# Patient Record
Sex: Female | Born: 1980 | Race: White | Hispanic: No | Marital: Single | Smoking: Current every day smoker
Health system: Southern US, Community
[De-identification: ages and names within clinical notes are randomized; demographics above are authoritative.]

## PROBLEM LIST (undated history)

## (undated) DIAGNOSIS — G8929 Other chronic pain: Secondary | ICD-10-CM

## (undated) DIAGNOSIS — F4310 Post-traumatic stress disorder, unspecified: Secondary | ICD-10-CM

## (undated) DIAGNOSIS — Z21 Asymptomatic human immunodeficiency virus [HIV] infection status: Secondary | ICD-10-CM

## (undated) DIAGNOSIS — F209 Schizophrenia, unspecified: Secondary | ICD-10-CM

## (undated) DIAGNOSIS — M549 Dorsalgia, unspecified: Secondary | ICD-10-CM

## (undated) HISTORY — PX: TUBAL LIGATION: SHX77

---

## 2004-09-24 ENCOUNTER — Emergency Department (HOSPITAL_COMMUNITY): Admission: EM | Admit: 2004-09-24 | Discharge: 2004-09-24 | Payer: Self-pay | Admitting: Emergency Medicine

## 2005-01-17 ENCOUNTER — Emergency Department (HOSPITAL_COMMUNITY): Admission: EM | Admit: 2005-01-17 | Discharge: 2005-01-18 | Payer: Self-pay | Admitting: *Deleted

## 2005-11-25 IMAGING — US US PELVIS COMPLETE MODIFY
1 series · 14 of 25 positions shown · non-contrast
Comparison: none

CLINICAL DATA: Bleeding x 3 weeks.  Question abscess.
 TRANSABDOMINAL AND TRANSVAGINAL NON-OB PELVIC ULTRASOUND:
 The uterus is normal in size and contour, measuring 8.0 x 4.0 x 4.9 cm in size.  There are no focal uterine lesions.  The endometrial stripe thickness measures 6 mm.  The ovaries are normal in size and contour with small simple appearing physiologic cysts present bilaterally.  There is no free pelvic fluid.

[Series 1: gyn · 0.32mm/px · 14 of 28 slices shown]
[im 1/28]
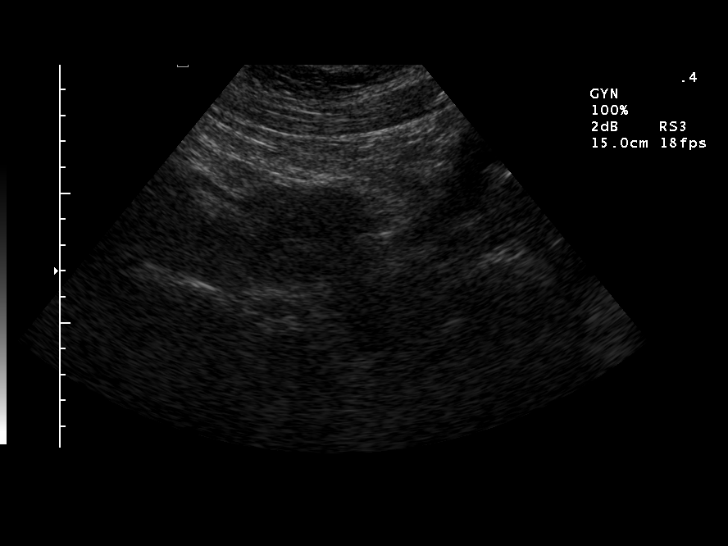
[im 3/28]
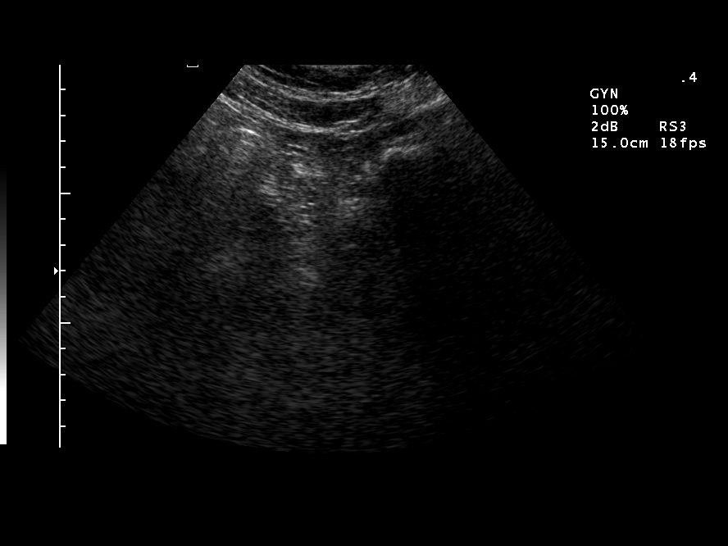
[im 5/28]
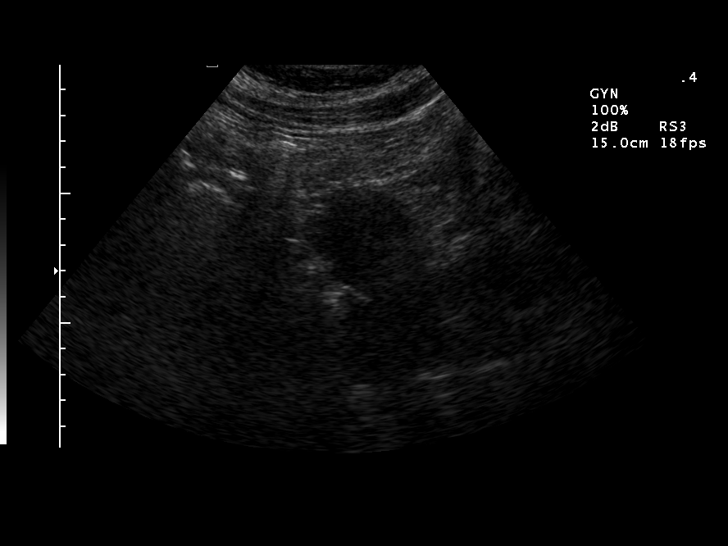
[im 7/28]
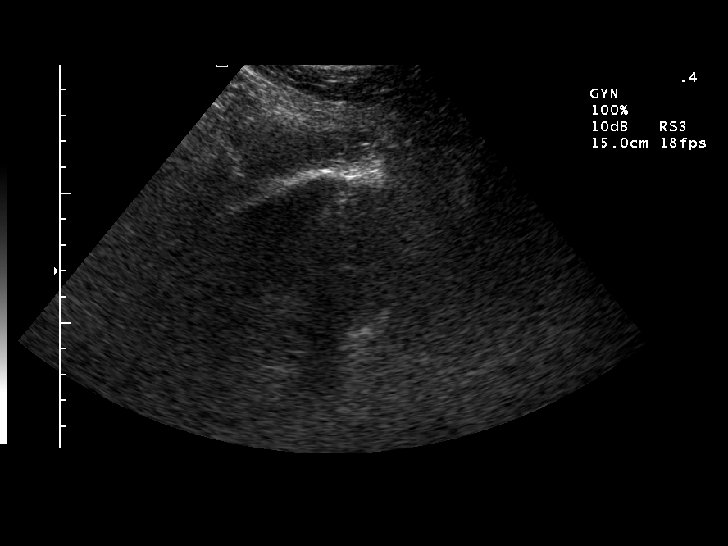
[im 10/28]
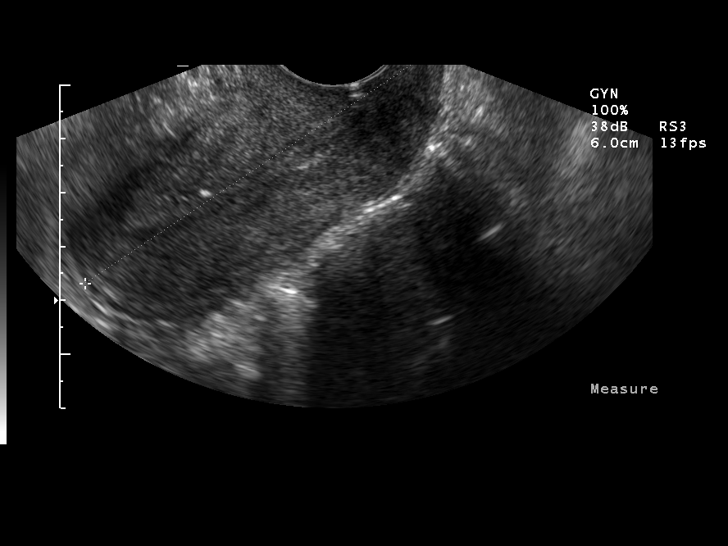
[im 11/28]
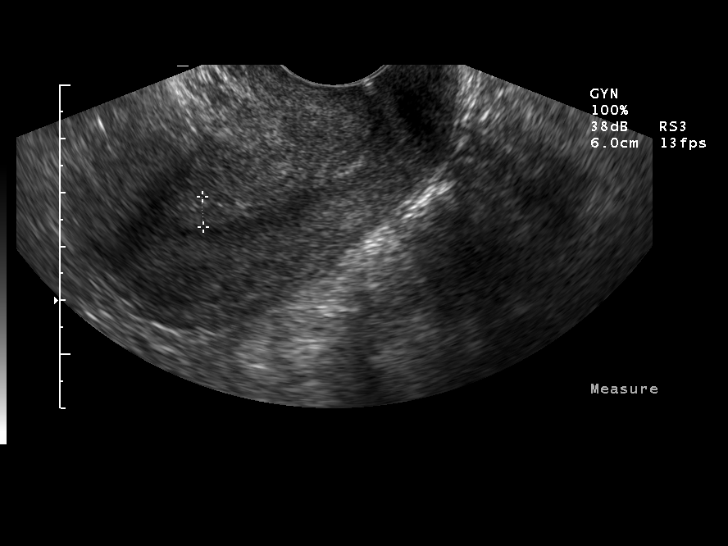
[im 13/28]
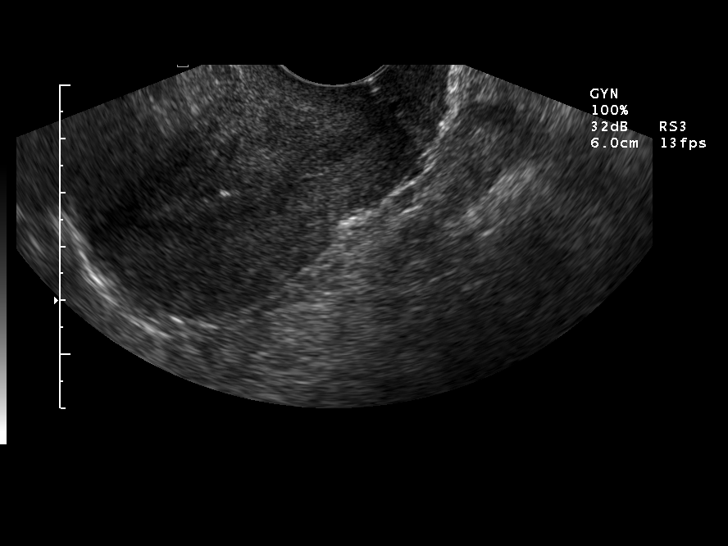
[im 15/28]
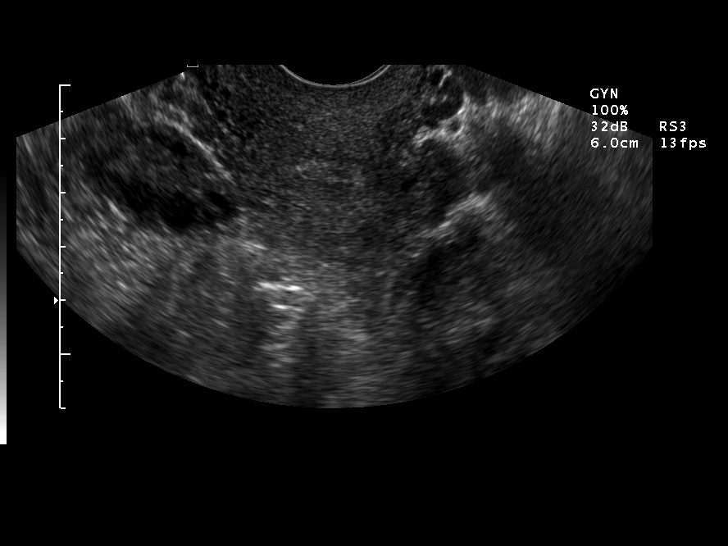
[im 17/28]
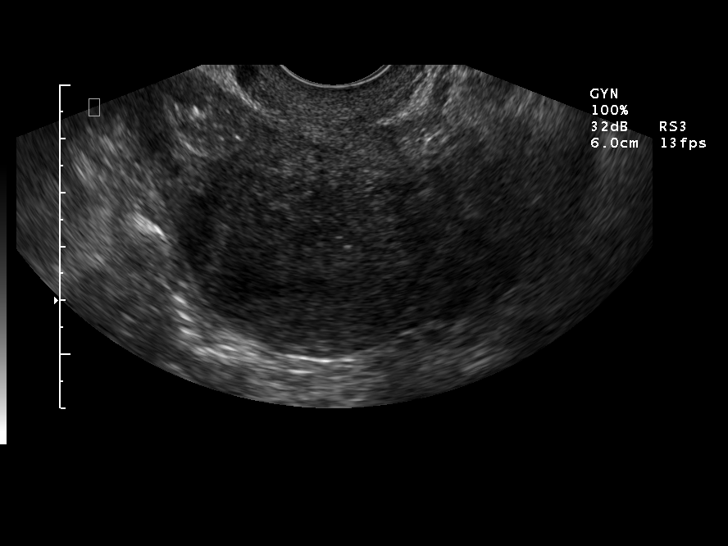
[im 19/28]
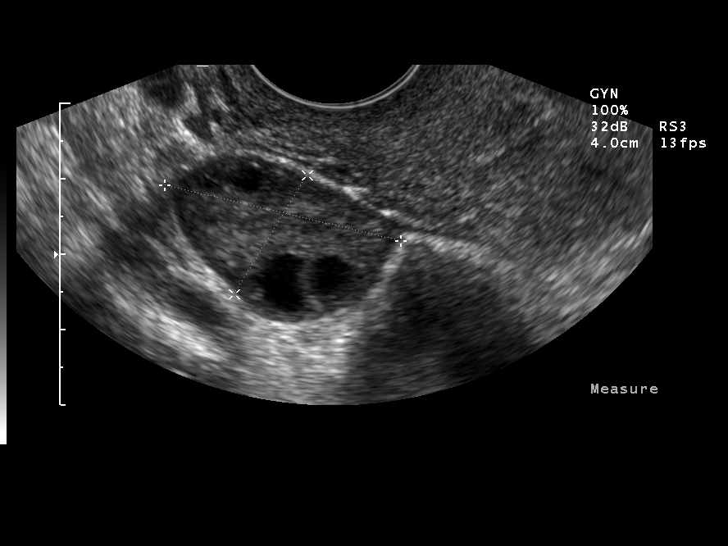
[im 21/28]
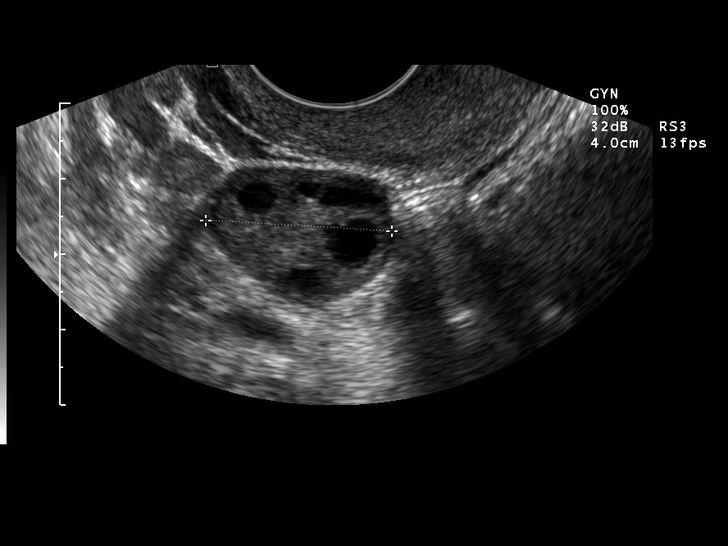
[im 23/28]
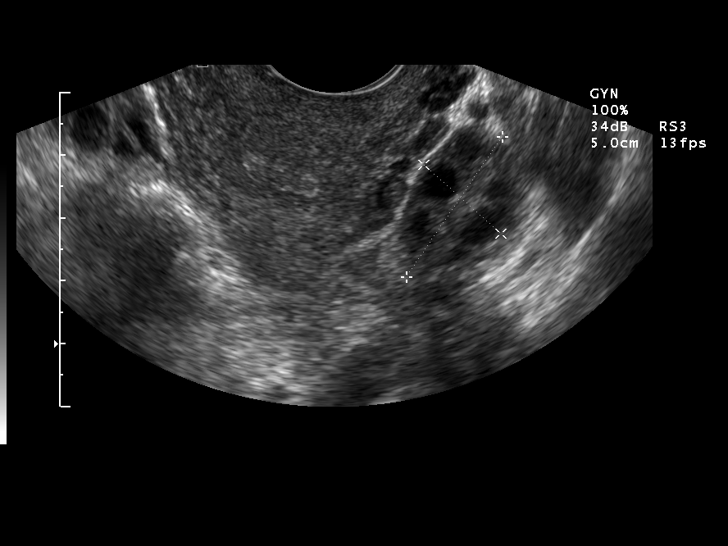
[im 25/28]
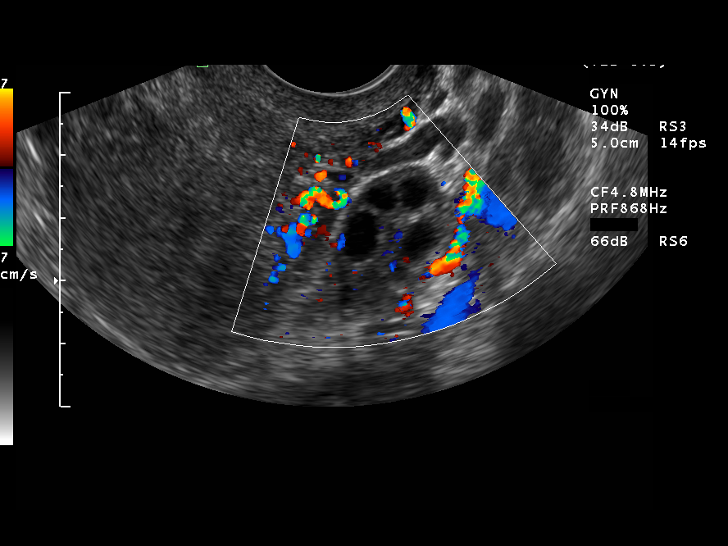
[im 28/28]
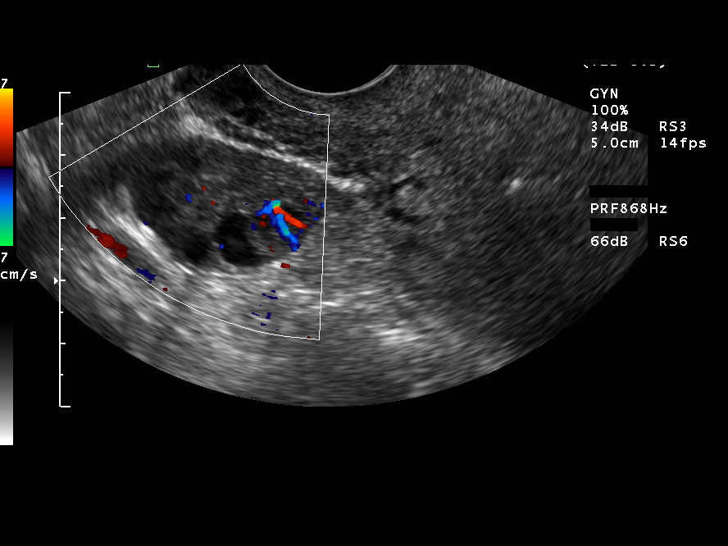

[14 of 25 positions shown; findings below may reference images not displayed]

IMPRESSION: Normal study.

## 2016-09-12 ENCOUNTER — Emergency Department (HOSPITAL_COMMUNITY)
Admission: EM | Admit: 2016-09-12 | Discharge: 2016-09-12 | Disposition: A | Discharge status: Home / Self Care | Payer: No Typology Code available for payment source | Attending: Emergency Medicine | Admitting: Emergency Medicine

## 2016-09-12 ENCOUNTER — Encounter (HOSPITAL_COMMUNITY): Payer: Self-pay | Admitting: Emergency Medicine

## 2016-09-12 ENCOUNTER — Emergency Department (HOSPITAL_COMMUNITY): Payer: No Typology Code available for payment source

## 2016-09-12 DIAGNOSIS — F17200 Nicotine dependence, unspecified, uncomplicated: Secondary | ICD-10-CM | POA: Insufficient documentation

## 2016-09-12 DIAGNOSIS — Y999 Unspecified external cause status: Secondary | ICD-10-CM | POA: Diagnosis not present

## 2016-09-12 DIAGNOSIS — Y939 Activity, unspecified: Secondary | ICD-10-CM | POA: Insufficient documentation

## 2016-09-12 DIAGNOSIS — Y92410 Unspecified street and highway as the place of occurrence of the external cause: Secondary | ICD-10-CM | POA: Diagnosis not present

## 2016-09-12 DIAGNOSIS — M546 Pain in thoracic spine: Secondary | ICD-10-CM | POA: Diagnosis not present

## 2016-09-12 HISTORY — DX: Dorsalgia, unspecified: M54.9

## 2016-09-12 HISTORY — DX: Other chronic pain: G89.29

## 2016-09-12 MED ORDER — DICLOFENAC SODIUM 75 MG PO TBEC: 75.0000 mg | DELAYED_RELEASE_TABLET | Freq: Two times a day (BID) | ORAL | 0 refills | Status: AC

## 2016-09-12 MED ORDER — METHOCARBAMOL 500 MG PO TABS: 500.0000 mg | ORAL_TABLET | Freq: Two times a day (BID) | ORAL | 0 refills | Status: DC
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
## 2016-09-12 NOTE — ED Notes (Signed)
Patient was alert, oriented and stable upon discharge. RN went over AVS and patient had no further questions.  
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
## 2024-01-12 ENCOUNTER — Emergency Department (HOSPITAL_BASED_OUTPATIENT_CLINIC_OR_DEPARTMENT_OTHER)

## 2024-01-12 ENCOUNTER — Encounter (HOSPITAL_BASED_OUTPATIENT_CLINIC_OR_DEPARTMENT_OTHER): Payer: Self-pay | Admitting: Emergency Medicine

## 2024-01-12 ENCOUNTER — Emergency Department (HOSPITAL_BASED_OUTPATIENT_CLINIC_OR_DEPARTMENT_OTHER)
Admission: EM | Admit: 2024-01-12 | Discharge: 2024-01-12 | Discharge status: Against Medical Advice | Attending: Emergency Medicine | Admitting: Emergency Medicine

## 2024-01-12 DIAGNOSIS — R059 Cough, unspecified: Secondary | ICD-10-CM | POA: Diagnosis not present

## 2024-01-12 DIAGNOSIS — M25562 Pain in left knee: Secondary | ICD-10-CM | POA: Insufficient documentation

## 2024-01-12 DIAGNOSIS — Z5321 Procedure and treatment not carried out due to patient leaving prior to being seen by health care provider: Secondary | ICD-10-CM | POA: Diagnosis not present

## 2024-01-12 DIAGNOSIS — M25511 Pain in right shoulder: Secondary | ICD-10-CM | POA: Insufficient documentation

## 2024-01-12 DIAGNOSIS — N939 Abnormal uterine and vaginal bleeding, unspecified: Secondary | ICD-10-CM | POA: Insufficient documentation

## 2024-01-12 HISTORY — DX: Asymptomatic human immunodeficiency virus (hiv) infection status: Z21

## 2024-01-12 HISTORY — DX: Post-traumatic stress disorder, unspecified: F43.10

## 2024-01-12 HISTORY — DX: Schizophrenia, unspecified: F20.9

## 2024-01-12 LAB — URINALYSIS, ROUTINE W REFLEX MICROSCOPIC
Bilirubin Urine: NEGATIVE
Glucose, UA: NEGATIVE mg/dL
Ketones, ur: NEGATIVE mg/dL
Leukocytes,Ua: NEGATIVE
Nitrite: NEGATIVE
Protein, ur: NEGATIVE mg/dL
Specific Gravity, Urine: 1.02 (ref 1.005–1.030)
pH: 6.5 (ref 5.0–8.0)

## 2024-01-12 LAB — URINALYSIS, MICROSCOPIC (REFLEX)

## 2024-01-12 LAB — RESP PANEL BY RT-PCR (RSV, FLU A&B, COVID)  RVPGX2
Influenza A by PCR: NEGATIVE
Influenza B by PCR: NEGATIVE
Resp Syncytial Virus by PCR: NEGATIVE
SARS Coronavirus 2 by RT PCR: NEGATIVE

## 2024-01-12 LAB — PREGNANCY, URINE: Preg Test, Ur: NEGATIVE

## 2024-01-12 NOTE — ED Triage Notes (Signed)
 Pt fell on Thurs; c/o LT knee pain and RT shoulder pain; pt also reports cough x 1 wk and wants to be checked for STIs; c/o vaginal bleeding x 1 month

## 2024-06-22 ENCOUNTER — Emergency Department (HOSPITAL_BASED_OUTPATIENT_CLINIC_OR_DEPARTMENT_OTHER): Admission: EM | Admit: 2024-06-22 | Discharge: 2024-06-22 | Disposition: A | Discharge status: Home / Self Care

## 2024-06-22 ENCOUNTER — Encounter (HOSPITAL_BASED_OUTPATIENT_CLINIC_OR_DEPARTMENT_OTHER): Payer: Self-pay | Admitting: Emergency Medicine

## 2024-06-22 DIAGNOSIS — Z113 Encounter for screening for infections with a predominantly sexual mode of transmission: Secondary | ICD-10-CM | POA: Insufficient documentation

## 2024-06-22 DIAGNOSIS — N898 Other specified noninflammatory disorders of vagina: Secondary | ICD-10-CM | POA: Diagnosis present

## 2024-06-22 DIAGNOSIS — N390 Urinary tract infection, site not specified: Secondary | ICD-10-CM

## 2024-06-22 DIAGNOSIS — A5900 Urogenital trichomoniasis, unspecified: Secondary | ICD-10-CM | POA: Diagnosis not present

## 2024-06-22 DIAGNOSIS — A599 Trichomoniasis, unspecified: Secondary | ICD-10-CM

## 2024-06-22 LAB — URINALYSIS, MICROSCOPIC (REFLEX)

## 2024-06-22 LAB — URINALYSIS, ROUTINE W REFLEX MICROSCOPIC
Glucose, UA: NEGATIVE mg/dL
Ketones, ur: NEGATIVE mg/dL
Leukocytes,Ua: NEGATIVE
Nitrite: NEGATIVE
Protein, ur: 30 mg/dL — AB
Specific Gravity, Urine: >=1.03 (ref 1.005–1.030)
pH: 5.5 (ref 5.0–8.0)

## 2024-06-22 LAB — WET PREP, GENITAL
Clue Cells Wet Prep HPF POC: NONE SEEN
Sperm: NONE SEEN
WBC, Wet Prep HPF POC: <10 (ref <10)
Yeast Wet Prep HPF POC: NONE SEEN

## 2024-06-22 LAB — PREGNANCY, URINE: Preg Test, Ur: NEGATIVE

## 2024-06-22 MED ORDER — OXYCODONE HCL 5 MG PO TABS
5.0000 mg | ORAL_TABLET | Freq: Once | ORAL | Status: AC
  Administered 2024-06-22: 5 mg via ORAL
  Filled 2024-06-22: qty 1

## 2024-06-22 MED ORDER — METRONIDAZOLE 500 MG PO TABS: 500.0000 mg | ORAL_TABLET | Freq: Two times a day (BID) | ORAL | 0 refills | Status: AC

## 2024-06-22 MED ORDER — METRONIDAZOLE 500 MG PO TABS
500.0000 mg | ORAL_TABLET | Freq: Once | ORAL | Status: AC
  Administered 2024-06-22: 500 mg via ORAL
  Filled 2024-06-22: qty 1

## 2024-06-22 MED ORDER — IBUPROFEN 400 MG PO TABS
400.0000 mg | ORAL_TABLET | Freq: Once | ORAL | Status: AC
  Administered 2024-06-22: 400 mg via ORAL
  Filled 2024-06-22: qty 1

## 2024-06-22 MED ORDER — METHOCARBAMOL 500 MG PO TABS
500.0000 mg | ORAL_TABLET | Freq: Once | ORAL | Status: AC
  Administered 2024-06-22: 500 mg via ORAL
  Filled 2024-06-22: qty 1

## 2024-06-22 MED ORDER — METHOCARBAMOL 500 MG PO TABS
500.0000 mg | ORAL_TABLET | Freq: Two times a day (BID) | ORAL | 0 refills | Status: AC
Start: 2024-06-22 — End: ?

## 2024-06-22 MED ORDER — LIDOCAINE 5 % EX PTCH
1.0000 | MEDICATED_PATCH | Freq: Once | CUTANEOUS | Status: DC
  Administered 2024-06-22: 1 via TRANSDERMAL
  Filled 2024-06-22: qty 1

## 2024-06-22 MED ORDER — LIDOCAINE 5 % EX PTCH: 1.0000 | MEDICATED_PATCH | CUTANEOUS | 0 refills | Status: AC

## 2024-06-22 MED ORDER — NITROFURANTOIN MONOHYD MACRO 100 MG PO CAPS
100.0000 mg | ORAL_CAPSULE | Freq: Two times a day (BID) | ORAL | 0 refills | Status: AC
Start: 2024-06-22 — End: ?

## 2024-06-22 MED ORDER — DOXYCYCLINE HYCLATE 100 MG PO CAPS: 100.0000 mg | ORAL_CAPSULE | Freq: Two times a day (BID) | ORAL | 0 refills | Status: AC

## 2024-06-22 MED ORDER — DOXYCYCLINE HYCLATE 100 MG PO TABS
100.0000 mg | ORAL_TABLET | Freq: Once | ORAL | Status: AC
  Administered 2024-06-22: 100 mg via ORAL
  Filled 2024-06-22: qty 1

## 2024-06-22 MED ORDER — LIDOCAINE HCL (PF) 1 % IJ SOLN
1.0000 mL | Freq: Once | INTRAMUSCULAR | Status: AC
  Administered 2024-06-22: 2.1 mL
  Filled 2024-06-22: qty 5

## 2024-06-22 MED ORDER — CEFTRIAXONE SODIUM 500 MG IJ SOLR
500.0000 mg | Freq: Once | INTRAMUSCULAR | Status: AC
  Administered 2024-06-22: 500 mg via INTRAMUSCULAR
  Filled 2024-06-22: qty 500

## 2024-06-22 NOTE — ED Notes (Signed)
 Pt c/o back pain and wants to be checked for STDs Pt requested drink and crackers

## 2024-06-22 NOTE — Discharge Instructions (Signed)
 We are prescribing antibiotics please take them as prescribed for the full course.  You may follow-up the results of your testing on MyChart or call back for results.  Please take over-the-counter medication such as ibuprofen for baseline pain control.  You may also use lidocaine patches and muscle relaxers for breakthrough pain.  Please follow-up with your primary doctor.

## 2024-06-22 NOTE — ED Triage Notes (Signed)
 Pt c/o vaginal discharge and odor, concerned for STD
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
## 2024-06-23 LAB — GC/CHLAMYDIA PROBE AMP (~~LOC~~) NOT AT ARMC
Chlamydia: NEGATIVE
Comment: NEGATIVE
Comment: NORMAL
Neisseria Gonorrhea: NEGATIVE
